# Patient Record
Sex: Male | Born: 1990 | Race: Black or African American | Hispanic: No | Marital: Single | Smoking: Current some day smoker
Health system: Southern US, Community
[De-identification: ages and names within clinical notes are randomized; demographics above are authoritative.]

## PROBLEM LIST (undated history)

## (undated) ENCOUNTER — Ambulatory Visit: Payer: Managed Care, Other (non HMO) | Source: Home / Self Care

## (undated) DIAGNOSIS — W3400XA Accidental discharge from unspecified firearms or gun, initial encounter: Secondary | ICD-10-CM

## (undated) HISTORY — PX: LUNG SURGERY: SHX703

## (undated) HISTORY — PX: LIVER SURGERY: SHX698

## (undated) HISTORY — PX: COLON SURGERY: SHX602

---

## 2018-08-17 ENCOUNTER — Emergency Department (HOSPITAL_COMMUNITY)
Admission: EM | Admit: 2018-08-17 | Discharge: 2018-08-18 | Disposition: A | Discharge status: Home / Self Care | Payer: Self-pay | Attending: Emergency Medicine | Admitting: Emergency Medicine

## 2018-08-17 ENCOUNTER — Encounter (HOSPITAL_COMMUNITY): Payer: Self-pay | Admitting: Emergency Medicine

## 2018-08-17 DIAGNOSIS — J111 Influenza due to unidentified influenza virus with other respiratory manifestations: Secondary | ICD-10-CM | POA: Insufficient documentation

## 2018-08-17 DIAGNOSIS — F17210 Nicotine dependence, cigarettes, uncomplicated: Secondary | ICD-10-CM | POA: Insufficient documentation

## 2018-08-17 DIAGNOSIS — R69 Illness, unspecified: Secondary | ICD-10-CM

## 2018-08-17 HISTORY — DX: Accidental discharge from unspecified firearms or gun, initial encounter: W34.00XA

## 2018-08-17 LAB — COMPREHENSIVE METABOLIC PANEL
ALK PHOS: 49 U/L (ref 38–126)
ALT: 17 U/L (ref 0–44)
AST: 27 U/L (ref 15–41)
Albumin: 3.9 g/dL (ref 3.5–5.0)
Anion gap: 11 (ref 5–15)
BILIRUBIN TOTAL: 1.3 mg/dL — AB (ref 0.3–1.2)
BUN: 15 mg/dL (ref 6–20)
CO2: 29 mmol/L (ref 22–32)
CREATININE: 1.19 mg/dL (ref 0.61–1.24)
Calcium: 9 mg/dL (ref 8.9–10.3)
Chloride: 98 mmol/L (ref 98–111)
GFR calc Af Amer: >60 mL/min (ref >60)
GLUCOSE: 95 mg/dL (ref 70–99)
Potassium: 3.5 mmol/L (ref 3.5–5.1)
Sodium: 138 mmol/L (ref 135–145)
TOTAL PROTEIN: 7.3 g/dL (ref 6.5–8.1)

## 2018-08-17 LAB — CBC WITH DIFFERENTIAL/PLATELET
Abs Immature Granulocytes: 0.01 10*3/uL (ref 0.00–0.07)
Basophils Absolute: 0 10*3/uL (ref 0.0–0.1)
Basophils Relative: 1 %
EOS PCT: 2 %
Eosinophils Absolute: 0.1 10*3/uL (ref 0.0–0.5)
HCT: 40.8 % (ref 39.0–52.0)
HEMOGLOBIN: 13.7 g/dL (ref 13.0–17.0)
Immature Granulocytes: 0 %
Lymphocytes Relative: 45 %
Lymphs Abs: 1.7 10*3/uL (ref 0.7–4.0)
MCH: 28.8 pg (ref 26.0–34.0)
MCHC: 33.6 g/dL (ref 30.0–36.0)
MCV: 85.9 fL (ref 80.0–100.0)
MONO ABS: 0.8 10*3/uL (ref 0.1–1.0)
MONOS PCT: 21 %
Neutro Abs: 1.2 10*3/uL — ABNORMAL LOW (ref 1.7–7.7)
Neutrophils Relative %: 31 %
Platelets: 233 10*3/uL (ref 150–400)
RBC: 4.75 MIL/uL (ref 4.22–5.81)
RDW: 12 % (ref 11.5–15.5)
WBC: 3.8 10*3/uL — ABNORMAL LOW (ref 4.0–10.5)
nRBC: 0 % (ref 0.0–0.2)

## 2018-08-17 LAB — I-STAT CG4 LACTIC ACID, ED: Lactic Acid, Venous: 1.15 mmol/L (ref 0.5–1.9)

## 2018-08-17 MED ORDER — ACETAMINOPHEN 325 MG PO TABS
650.0000 mg | ORAL_TABLET | Freq: Once | ORAL | Status: AC
  Administered 2018-08-17: 650 mg via ORAL
  Filled 2018-08-17: qty 2

## 2018-08-17 NOTE — ED Triage Notes (Signed)
Pt arrives with fever for 3-4 days. Reports back pain, cough and congestion.

## 2018-08-18 LAB — I-STAT CG4 LACTIC ACID, ED: LACTIC ACID, VENOUS: 1.08 mmol/L (ref 0.5–1.9)

## 2018-08-18 MED ORDER — DOXYCYCLINE HYCLATE 100 MG PO CAPS: 100.0000 mg | ORAL_CAPSULE | Freq: Two times a day (BID) | ORAL | 0 refills | Status: DC

## 2018-08-18 NOTE — ED Notes (Signed)
Reviewed d/c instructions with pt, who verbalized understanding and had no outstanding questions. Pt departed in NAD, refused use of wheelchair.   
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
## 2018-08-18 NOTE — Discharge Instructions (Addendum)
Tylenol 1000 mg rotated with Motrin 600 mg every 4 hours as needed for pain or fever.  If you are not improving in the next 2 to 3 days, fill the prescription for doxycycline you have been given this evening.  Return to the ER in the meantime if symptoms significantly worsen or change.

## 2018-11-29 ENCOUNTER — Emergency Department (HOSPITAL_COMMUNITY)
Admission: EM | Admit: 2018-11-29 | Discharge: 2018-11-29 | Disposition: A | Discharge status: Home / Self Care | Payer: Self-pay | Attending: Emergency Medicine | Admitting: Emergency Medicine

## 2018-11-29 ENCOUNTER — Encounter (HOSPITAL_COMMUNITY): Payer: Self-pay | Admitting: Obstetrics and Gynecology

## 2018-11-29 ENCOUNTER — Emergency Department (HOSPITAL_COMMUNITY): Payer: Self-pay

## 2018-11-29 ENCOUNTER — Other Ambulatory Visit: Payer: Self-pay

## 2018-11-29 DIAGNOSIS — Y939 Activity, unspecified: Secondary | ICD-10-CM | POA: Insufficient documentation

## 2018-11-29 DIAGNOSIS — R52 Pain, unspecified: Secondary | ICD-10-CM

## 2018-11-29 DIAGNOSIS — S6991XA Unspecified injury of right wrist, hand and finger(s), initial encounter: Secondary | ICD-10-CM | POA: Insufficient documentation

## 2018-11-29 DIAGNOSIS — Y999 Unspecified external cause status: Secondary | ICD-10-CM | POA: Insufficient documentation

## 2018-11-29 DIAGNOSIS — S40021A Contusion of right upper arm, initial encounter: Secondary | ICD-10-CM | POA: Insufficient documentation

## 2018-11-29 DIAGNOSIS — S8001XA Contusion of right knee, initial encounter: Secondary | ICD-10-CM | POA: Insufficient documentation

## 2018-11-29 DIAGNOSIS — F17210 Nicotine dependence, cigarettes, uncomplicated: Secondary | ICD-10-CM | POA: Insufficient documentation

## 2018-11-29 DIAGNOSIS — Y929 Unspecified place or not applicable: Secondary | ICD-10-CM | POA: Insufficient documentation

## 2018-11-29 MED ORDER — AMOXICILLIN-POT CLAVULANATE 875-125 MG PO TABS: 1.0000 | ORAL_TABLET | Freq: Two times a day (BID) | ORAL | 0 refills | Status: DC

## 2018-11-29 MED ORDER — LIDOCAINE HCL (PF) 1 % IJ SOLN
5.0000 mL | Freq: Once | INTRAMUSCULAR | Status: AC
  Administered 2018-11-29: 5 mL
  Filled 2018-11-29: qty 30

## 2018-11-29 MED ORDER — NAPROXEN 500 MG PO TABS: 500.0000 mg | ORAL_TABLET | Freq: Two times a day (BID) | ORAL | 0 refills | Status: DC

## 2018-11-29 MED ORDER — IBUPROFEN 200 MG PO TABS
600.0000 mg | ORAL_TABLET | Freq: Once | ORAL | Status: AC
  Administered 2018-11-29: 600 mg via ORAL
  Filled 2018-11-29: qty 3

## 2018-11-29 NOTE — ED Notes (Signed)
[phone] [address]
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
## 2018-11-29 NOTE — Discharge Instructions (Addendum)
Call Dr. Ronie Spies office tomorrow and they will tell you what time to see him in the office. It is important that you take the antibiotic and keep the wound clean.
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
## 2019-11-03 ENCOUNTER — Emergency Department (HOSPITAL_COMMUNITY): Payer: Self-pay

## 2019-11-03 ENCOUNTER — Other Ambulatory Visit: Payer: Self-pay

## 2019-11-03 ENCOUNTER — Emergency Department (HOSPITAL_COMMUNITY)
Admission: EM | Admit: 2019-11-03 | Discharge: 2019-11-03 | Disposition: A | Discharge status: Home / Self Care | Payer: Self-pay | Attending: Emergency Medicine | Admitting: Emergency Medicine

## 2019-11-03 DIAGNOSIS — Y999 Unspecified external cause status: Secondary | ICD-10-CM | POA: Diagnosis not present

## 2019-11-03 DIAGNOSIS — S199XXA Unspecified injury of neck, initial encounter: Secondary | ICD-10-CM | POA: Diagnosis present

## 2019-11-03 DIAGNOSIS — F17210 Nicotine dependence, cigarettes, uncomplicated: Secondary | ICD-10-CM | POA: Insufficient documentation

## 2019-11-03 DIAGNOSIS — Y93I9 Activity, other involving external motion: Secondary | ICD-10-CM | POA: Diagnosis not present

## 2019-11-03 DIAGNOSIS — Z79899 Other long term (current) drug therapy: Secondary | ICD-10-CM | POA: Diagnosis not present

## 2019-11-03 DIAGNOSIS — Z791 Long term (current) use of non-steroidal anti-inflammatories (NSAID): Secondary | ICD-10-CM | POA: Insufficient documentation

## 2019-11-03 DIAGNOSIS — S161XXA Strain of muscle, fascia and tendon at neck level, initial encounter: Secondary | ICD-10-CM | POA: Insufficient documentation

## 2019-11-03 DIAGNOSIS — R519 Headache, unspecified: Secondary | ICD-10-CM | POA: Diagnosis not present

## 2019-11-03 DIAGNOSIS — Y92410 Unspecified street and highway as the place of occurrence of the external cause: Secondary | ICD-10-CM | POA: Diagnosis not present

## 2019-11-03 MED ORDER — ACETAMINOPHEN 325 MG PO TABS
650.0000 mg | ORAL_TABLET | Freq: Once | ORAL | Status: AC
  Administered 2019-11-03: 650 mg via ORAL
  Filled 2019-11-03: qty 2

## 2019-11-03 MED ORDER — IBUPROFEN 200 MG PO TABS
600.0000 mg | ORAL_TABLET | Freq: Once | ORAL | Status: AC
  Administered 2019-11-03: 15:00:00 600 mg via ORAL
  Filled 2019-11-03 (×2): qty 3

## 2019-11-03 MED ORDER — METHOCARBAMOL 500 MG PO TABS: 500.0000 mg | ORAL_TABLET | Freq: Three times a day (TID) | ORAL | 0 refills | Status: DC | PRN

## 2019-11-03 NOTE — Discharge Instructions (Addendum)
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
## 2023-02-01 ENCOUNTER — Encounter (HOSPITAL_BASED_OUTPATIENT_CLINIC_OR_DEPARTMENT_OTHER): Payer: Self-pay

## 2023-02-01 ENCOUNTER — Other Ambulatory Visit: Payer: Self-pay

## 2023-02-01 DIAGNOSIS — Z5329 Procedure and treatment not carried out because of patient's decision for other reasons: Secondary | ICD-10-CM | POA: Diagnosis not present

## 2023-02-01 DIAGNOSIS — M79673 Pain in unspecified foot: Secondary | ICD-10-CM | POA: Insufficient documentation

## 2023-02-01 DIAGNOSIS — F17210 Nicotine dependence, cigarettes, uncomplicated: Secondary | ICD-10-CM | POA: Diagnosis not present

## 2023-02-01 NOTE — ED Triage Notes (Signed)
Pt reports a bump behind the arch of the RT foot that has been getting worse for about a month. Pt with 2+ DP pulse. Pt with sensation intact. Reports is making it difficult for him to walk. Not diabetics.

## 2023-02-02 ENCOUNTER — Emergency Department (HOSPITAL_BASED_OUTPATIENT_CLINIC_OR_DEPARTMENT_OTHER)
Admission: EM | Admit: 2023-02-02 | Discharge: 2023-02-02 | Discharge status: Against Medical Advice | Payer: Managed Care, Other (non HMO) | Attending: Emergency Medicine | Admitting: Emergency Medicine

## 2023-02-02 ENCOUNTER — Ambulatory Visit
Admission: EM | Admit: 2023-02-02 | Discharge: 2023-02-02 | Disposition: A | Discharge status: Home / Self Care | Payer: Managed Care, Other (non HMO) | Attending: Family Medicine | Admitting: Family Medicine

## 2023-02-02 DIAGNOSIS — L84 Corns and callosities: Secondary | ICD-10-CM

## 2023-02-02 DIAGNOSIS — M79673 Pain in unspecified foot: Secondary | ICD-10-CM

## 2023-02-02 MED ORDER — LIDOCAINE HCL (PF) 1 % IJ SOLN
30.0000 mL | Freq: Once | INTRAMUSCULAR | Status: DC
  Filled 2023-02-02: qty 30

## 2023-02-02 NOTE — ED Triage Notes (Signed)
Pt presents with callous on bottom of right foot X 1 month.
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
## 2023-02-02 NOTE — ED Notes (Signed)
Pt left AMA, was able to get pt to sign AMA in chart, Dr. Pilar Plate notified. PT refused vitals on departure.
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
## 2023-05-14 ENCOUNTER — Encounter (HOSPITAL_COMMUNITY): Payer: Self-pay

## 2023-05-14 ENCOUNTER — Emergency Department (HOSPITAL_COMMUNITY): Payer: Managed Care, Other (non HMO)

## 2023-05-14 ENCOUNTER — Emergency Department (HOSPITAL_COMMUNITY)
Admission: EM | Admit: 2023-05-14 | Discharge: 2023-05-14 | Disposition: A | Discharge status: Home / Self Care | Payer: Managed Care, Other (non HMO) | Attending: Emergency Medicine | Admitting: Emergency Medicine

## 2023-05-14 ENCOUNTER — Other Ambulatory Visit: Payer: Self-pay

## 2023-05-14 DIAGNOSIS — N492 Inflammatory disorders of scrotum: Secondary | ICD-10-CM

## 2023-05-14 LAB — URINALYSIS, ROUTINE W REFLEX MICROSCOPIC
Bilirubin Urine: NEGATIVE
Glucose, UA: NEGATIVE mg/dL
Hgb urine dipstick: NEGATIVE
Ketones, ur: NEGATIVE mg/dL
Leukocytes,Ua: NEGATIVE
Nitrite: NEGATIVE
Protein, ur: NEGATIVE mg/dL
Specific Gravity, Urine: 1.025 (ref 1.005–1.030)
pH: 6 (ref 5.0–8.0)

## 2023-05-14 LAB — CBC WITH DIFFERENTIAL/PLATELET
Abs Immature Granulocytes: 0.02 10*3/uL (ref 0.00–0.07)
Basophils Absolute: 0.1 10*3/uL (ref 0.0–0.1)
Basophils Relative: 1 %
Eosinophils Absolute: 0.3 10*3/uL (ref 0.0–0.5)
Eosinophils Relative: 4 %
HCT: 40.8 % (ref 39.0–52.0)
Hemoglobin: 14.2 g/dL (ref 13.0–17.0)
Immature Granulocytes: 0 %
Lymphocytes Relative: 22 %
Lymphs Abs: 2.1 10*3/uL (ref 0.7–4.0)
MCH: 29.5 pg (ref 26.0–34.0)
MCHC: 34.8 g/dL (ref 30.0–36.0)
MCV: 84.6 fL (ref 80.0–100.0)
Monocytes Absolute: 2.1 10*3/uL — ABNORMAL HIGH (ref 0.1–1.0)
Monocytes Relative: 22 %
Neutro Abs: 4.8 10*3/uL (ref 1.7–7.7)
Neutrophils Relative %: 51 %
Platelets: 322 10*3/uL (ref 150–400)
RBC: 4.82 MIL/uL (ref 4.22–5.81)
RDW: 12.1 % (ref 11.5–15.5)
WBC: 9.3 10*3/uL (ref 4.0–10.5)
nRBC: 0 % (ref 0.0–0.2)

## 2023-05-14 LAB — COMPREHENSIVE METABOLIC PANEL
ALT: 24 U/L (ref 0–44)
AST: 27 U/L (ref 15–41)
Albumin: 3.8 g/dL (ref 3.5–5.0)
Alkaline Phosphatase: 63 U/L (ref 38–126)
Anion gap: 12 (ref 5–15)
BUN: 14 mg/dL (ref 6–20)
CO2: 23 mmol/L (ref 22–32)
Calcium: 9.2 mg/dL (ref 8.9–10.3)
Chloride: 98 mmol/L (ref 98–111)
Creatinine, Ser: 0.89 mg/dL (ref 0.61–1.24)
GFR, Estimated: >60 mL/min (ref >60)
Glucose, Bld: 83 mg/dL (ref 70–99)
Potassium: 3.6 mmol/L (ref 3.5–5.1)
Sodium: 133 mmol/L — ABNORMAL LOW (ref 135–145)
Total Bilirubin: 1.1 mg/dL (ref 0.3–1.2)
Total Protein: 7.8 g/dL (ref 6.5–8.1)

## 2023-05-14 LAB — LIPASE, BLOOD: Lipase: 32 U/L (ref 11–51)

## 2023-05-14 MED ORDER — ONDANSETRON HCL 4 MG/2ML IJ SOLN
4.0000 mg | Freq: Once | INTRAMUSCULAR | Status: AC
  Administered 2023-05-14: 4 mg via INTRAVENOUS
  Filled 2023-05-14: qty 2

## 2023-05-14 MED ORDER — MORPHINE SULFATE (PF) 4 MG/ML IV SOLN
4.0000 mg | Freq: Once | INTRAVENOUS | Status: AC
  Administered 2023-05-14: 4 mg via INTRAVENOUS
  Filled 2023-05-14: qty 1

## 2023-05-14 MED ORDER — SODIUM CHLORIDE 0.9 % IV BOLUS
1000.0000 mL | Freq: Once | INTRAVENOUS | Status: AC
  Administered 2023-05-14: 1000 mL via INTRAVENOUS

## 2023-05-14 MED ORDER — DOXYCYCLINE HYCLATE 100 MG PO CAPS: 100.0000 mg | ORAL_CAPSULE | Freq: Two times a day (BID) | ORAL | 0 refills | Status: AC

## 2023-05-14 MED ORDER — IOHEXOL 300 MG/ML  SOLN
100.0000 mL | Freq: Once | INTRAMUSCULAR | Status: AC | PRN
  Administered 2023-05-14: 100 mL via INTRAVENOUS

## 2023-05-14 MED ORDER — DOXYCYCLINE HYCLATE 100 MG PO TABS
100.0000 mg | ORAL_TABLET | Freq: Once | ORAL | Status: AC
  Administered 2023-05-14: 100 mg via ORAL
  Filled 2023-05-14: qty 1

## 2023-05-14 MED ORDER — LIDOCAINE HCL (PF) 1 % IJ SOLN
1.0000 mL | Freq: Once | INTRAMUSCULAR | Status: AC
  Administered 2023-05-14: 2.1 mL
  Filled 2023-05-14: qty 30

## 2023-05-14 MED ORDER — CEFTRIAXONE SODIUM 1 G IJ SOLR
500.0000 mg | Freq: Once | INTRAMUSCULAR | Status: AC
  Administered 2023-05-14: 500 mg via INTRAMUSCULAR
  Filled 2023-05-14: qty 10

## 2023-05-14 MED ORDER — SODIUM CHLORIDE (PF) 0.9 % IJ SOLN
INTRAMUSCULAR | Status: AC
  Filled 2023-05-14: qty 50

## 2023-05-14 NOTE — ED Triage Notes (Signed)
Pt reports abscess to right side groin and reports pain and discharge from area. Pt denies pain with urination and denies penile discharge.

## 2023-05-14 NOTE — Discharge Instructions (Addendum)
Evaluation revealed he did have a scrotal abscess.  Treating with doxycycline which is an antibiotic.  Please follow-up with urology as well for reevaluation.  If your symptoms worsen in any way please return for further evaluation.  Please follow-up MyChart for the results of your STD panel.  Advised that you abstain from sex until the results are posted.

## 2023-05-14 NOTE — ED Provider Notes (Cosign Needed Addendum)
[place] EMERGENCY DEPARTMENT AT [facility] Provider Note   CSN: [id number] Arrival date & time: 05/14/23  0532     History  Chief Complaint  Patient presents with   Abscess   HPI Steven Lowe is a 32 y.o. male presenting for abscess.  Noticed it about 3 days ago.  Located in the right side of his groin extending down into his scrotum.  States it started to ooze pustulant discharge earlier this morning.  States it is very painful.  The pain radiates into his groin.  Also notes another bump in his right groin as well that he noticed shortly after the abscess. Denies fever and chills. Also requesting STI evaluation. Denies penile discharge or dysuria.   Abscess      Home Medications Prior to Admission medications   Medication Sig Start Date End Date Taking? Authorizing Provider  doxycycline (VIBRAMYCIN) 100 MG capsule Take 1 capsule (100 mg total) by mouth 2 (two) times daily. 05/14/23  Yes Gareth Eagle, PA-C      Allergies    Patient has no known allergies.    Review of Systems   See HPI for pertinent positves  Physical Exam Updated Vital Signs BP (!) 97/56 (BP Location: Right Arm)   Pulse 89   Temp 98.4 F (36.9 C)   Resp 18   Ht 5\' 7"  (1.702 m)   Wt 59 kg   SpO2 99%   BMI 20.36 kg/m  Physical Exam Vitals and nursing note reviewed.  HENT:     Head: Normocephalic and atraumatic.     Mouth/Throat:     Mouth: Mucous membranes are moist.  Eyes:     General:        Right eye: No discharge.        Left eye: No discharge.     Conjunctiva/sclera: Conjunctivae normal.  Cardiovascular:     Rate and Rhythm: Normal rate and regular rhythm.     Pulses: Normal pulses.     Heart sounds: Normal heart sounds.  Pulmonary:     Effort: Pulmonary effort is normal.     Breath sounds: Normal breath sounds.  Abdominal:     General: Abdomen is flat.     Palpations: Abdomen is soft.     Tenderness: There is no abdominal tenderness.   Genitourinary:     Skin:    General: Skin is warm and dry.  Neurological:     General: No focal deficit present.  Psychiatric:        Mood and Affect: Mood normal.     ED Results / Procedures / Treatments   Labs (all labs ordered are listed, but only abnormal results are displayed) Labs Reviewed  COMPREHENSIVE METABOLIC PANEL - Abnormal; Notable for the following components:      Result Value   Sodium 133 (*)    All other components within normal limits  CBC WITH DIFFERENTIAL/PLATELET - Abnormal; Notable for the following components:   Monocytes Absolute 2.1 (*)    All other components within normal limits  LIPASE, BLOOD  URINALYSIS, ROUTINE W REFLEX MICROSCOPIC  CBC WITH DIFFERENTIAL/PLATELET  HIV ANTIBODY (ROUTINE TESTING W REFLEX)  RPR  GC/CHLAMYDIA PROBE AMP ([place]) NOT AT [facility]    EKG None  Radiology CT ABDOMEN PELVIS W CONTRAST  Result Date: 05/14/2023 CLINICAL DATA:  32 year old male with history of scrotal abscess concerning for possible Fournier's gangrene. EXAM: CT ABDOMEN AND PELVIS WITH CONTRAST TECHNIQUE: Multidetector CT imaging of the abdomen  and pelvis was performed using the standard protocol following bolus administration of intravenous contrast. RADIATION DOSE REDUCTION: This exam was performed according to the departmental dose-optimization program which includes automated exposure control, adjustment of the mA and/or kV according to patient size and/or use of iterative reconstruction technique. CONTRAST:  OMNIPAQUE IOHEXOL 300 MG/ML  SOLN COMPARISON:  No priors. FINDINGS: Lower chest: Unremarkable. Hepatobiliary: No suspicious cystic or solid hepatic lesions. No intra or extrahepatic biliary ductal dilatation. Gallbladder is unremarkable in appearance. Pancreas: No pancreatic mass. No pancreatic ductal dilatation. No pancreatic or peripancreatic fluid collections or inflammatory changes. Spleen: Spleen is not visualized, presumably surgically  absent. Adrenals/Urinary Tract: Bilateral kidneys and adrenal glands are normal in appearance. No hydroureteronephrosis. Urinary bladder is unremarkable in appearance. Stomach/Bowel: The appearance of the stomach is normal. No pathologic dilatation of small bowel or colon. The appendix is not confidently identified and may be surgically absent. Regardless, there are no inflammatory changes noted adjacent to the cecum to suggest the presence of an acute appendicitis at this time. Vascular/Lymphatic: No significant atherosclerotic disease, aneurysm or dissection noted in the abdominal or pelvic vasculature. Enlarged right inguinal lymph node measuring 1.4 cm in short axis. Enlarged right obturator lymph node measuring 1.2 cm in short axis. Prominent borderline enlarged left inguinal lymph node measuring 8 mm in short axis. No other lymphadenopathy noted elsewhere in the abdomen or pelvis. Reproductive: Prostate gland and seminal vesicles are unremarkable in appearance. Visualized portions of the scrotum are grossly unremarkable. Other: In the right inguinal region there is skin thickening and enhancement adjacent to the scrotum, best appreciated on axial image 82 of series 2, without well-defined rim enhancing fluid collection to indicate an abscess. No significant volume of ascites. No pneumoperitoneum. Musculoskeletal: There are no aggressive appearing lytic or blastic lesions noted in the visualized portions of the skeleton. IMPRESSION: 1. Skin thickening and enhancement in the right inguinal region involving the medial upper right thigh and a portion of the scrotum, likely reflecting cellulitis. No definite abscess identified at this time. Multiple borderline enlarged and mildly enlarged pelvic lymph nodes, as above, likely reactive. 2. No other acute findings noted elsewhere in the abdomen or pelvis. Electronically Signed   By: Trudie Reed M.D.   On: 05/14/2023 08:21    Procedures Procedures     Medications Ordered in ED Medications  sodium chloride 0.9 % bolus 1,000 mL (0 mLs Intravenous Stopped 05/14/23 0919)  morphine (PF) 4 MG/ML injection 4 mg (4 mg Intravenous Given 05/14/23 0743)  ondansetron (ZOFRAN) injection 4 mg (4 mg Intravenous Given 05/14/23 0743)  iohexol (OMNIPAQUE) 300 MG/ML solution 100 mL (100 mLs Intravenous Contrast Given 05/14/23 0759)  cefTRIAXone (ROCEPHIN) injection 500 mg (500 mg Intramuscular Given 05/14/23 0919)  lidocaine (PF) (XYLOCAINE) 1 % injection 1-2.1 mL (2.1 mLs Other Given 05/14/23 0919)  doxycycline (VIBRA-TABS) tablet 100 mg (100 mg Oral Given 05/14/23 1191)    ED Course/ Medical Decision Making/ A&P Clinical Course as of 05/14/23 0941  Sat May 14, 2023  0754 Eosinophils Absolute: 0.3 [JR]    Clinical Course User Index [JR] Gareth Eagle, PA-C                             Medical Decision Making Amount and/or Complexity of Data Reviewed Labs: ordered. Decision-making details documented in ED Course. Radiology: ordered.  Risk Prescription drug management.   32 year old well-appearing male presenting for abscess in the groin. Exam notable  for open scrotal abscess and and likely associated lymphadenopathy.  Likely simple scrotal abscess is actively draining but could not fully rule out Fournier's gangrene.  CT scan however was fortunately negative aside from associated cellulitis. Started on doxycycline and gave dose of ceftriaxone as well for treatment of STI empirically.  Advised him to follow-up with urology for scrotal abscess reevaluation.  Also advised to follow MyChart for his STI results.     Final Clinical Impression(s) / ED Diagnoses Final diagnoses:  Scrotal abscess    Rx / DC Orders ED Discharge Orders          Ordered    doxycycline (VIBRAMYCIN) 100 MG capsule  2 times daily        05/14/23 0940              Gareth Eagle, PA-C 05/14/23 0939    Gareth Eagle, PA-C 05/14/23 1610    Bethann Berkshire, MD 05/16/23 1114

## 2023-05-14 NOTE — ED Notes (Signed)
Pt aware urine sample needed. Given urinal.

## 2023-05-15 ENCOUNTER — Telehealth: Payer: Managed Care, Other (non HMO) | Admitting: Nurse Practitioner

## 2023-05-15 DIAGNOSIS — N5089 Other specified disorders of the male genital organs: Secondary | ICD-10-CM

## 2023-05-15 NOTE — Patient Instructions (Signed)
  Steven Lowe, thank you for joining Claiborne Rigg, NP for today's virtual visit.  While this provider is not your primary care provider (PCP), if your PCP is located in our provider database this encounter information will be shared with them immediately following your visit.   A [facility] MyChart account gives you access to today's visit and all your visits, tests, and labs performed at [facility] " click here if you don't have a [facility] MyChart account or go to [email or web address]  Consent: (Patient) Steven Lowe provided verbal consent for this virtual visit at the beginning of the encounter.  Current Medications:  Current Outpatient Medications:    doxycycline (VIBRAMYCIN) 100 MG capsule, Take 1 capsule (100 mg total) by mouth 2 (two) times daily., Disp: 20 capsule, Rfl: 0   Medications ordered in this encounter:  No orders of the defined types were placed in this encounter.    *If you need refills on other medications prior to your next appointment, please contact your pharmacy*  Follow-Up: Call back or seek an in-person evaluation if the symptoms worsen or if the condition fails to improve as anticipated.  [facility] [phone]  Other Instructions Needs additional testing: HSV, Trich, HIV, syphilis. Recommended urgent care or Health department as this is not an emergency. GC/Chlamydia results are not finalized at this time.   Finish abx as instructed   If you have been instructed to have an in-person evaluation today at a local Urgent Care facility, please use the link below. It will take you to a list of all of our available [facility], including address, phone number and hours of operation. Please do not delay care.  [facility]  If you or a family member do not have a primary care provider, use the link below to schedule a visit and establish care. When you choose a [facility] primary care  physician or advanced practice provider, you gain a long-term partner in health. Find a Primary Care Provider  Learn more about [facility]'s in-office and virtual care options: [facility] - Get Care Now

## 2023-05-15 NOTE — Progress Notes (Signed)
Virtual Visit Consent   Steven Lowe, you are scheduled for a virtual visit with a [facility] provider today. Just as with appointments in the office, your consent must be obtained to participate. Your consent will be active for this visit and any virtual visit you may have with one of our providers in the next 365 days. If you have a MyChart account, a copy of this consent can be sent to you electronically.  As this is a virtual visit, video technology does not allow for your provider to perform a traditional examination. This may limit your provider's ability to fully assess your condition. If your provider identifies any concerns that need to be evaluated in person or the need to arrange testing (such as labs, EKG, etc.), we will make arrangements to do so. Although advances in technology are sophisticated, we cannot ensure that it will always work on either your end or our end. If the connection with a video visit is poor, the visit may have to be switched to a telephone visit. With either a video or telephone visit, we are not always able to ensure that we have a secure connection.  By engaging in this virtual visit, you consent to the provision of healthcare and authorize for your insurance to be billed (if applicable) for the services provided during this visit. Depending on your insurance coverage, you may receive a charge related to this service.  I need to obtain your verbal consent now. Are you willing to proceed with your visit today? Amedeo A Constantino has provided verbal consent on 05/15/2023 for a virtual visit (video or telephone). Claiborne Rigg, NP  Date: 05/15/2023 9:00 AM  Virtual Visit via Video Note   I, Claiborne Rigg, connected with  Steven Lowe  ([id number], 1990-12-21) on 05/15/23 at  8:45 AM EDT by a video-enabled telemedicine application and verified that I am speaking with the correct person using two identifiers.  Location: Patient: Virtual Visit Location  Patient: Home Provider: Virtual Visit Location Provider: Home Office   I discussed the limitations of evaluation and management by telemedicine and the availability of in person appointments. The patient expressed understanding and agreed to proceed.    History of Present Illness: Steven Lowe is a 32 y.o. who identifies as a male who was assigned male at birth, and is being seen today for requesting labs results.  HPI: [name] is requesting lab results for recent GC/Chlamydia testing. He also states he has sores in his rectal area and wants to know what this could be. He has been sexually active with only 1 partner for the past 6 years however they recently broke up. He was seen in the ED yesterday for penile abscess and was prescribed doxy and instructed to follow up with Urology.   Problems: There are no problems to display for this patient.   Allergies: No Known Allergies Medications:  Current Outpatient Medications:    doxycycline (VIBRAMYCIN) 100 MG capsule, Take 1 capsule (100 mg total) by mouth 2 (two) times daily., Disp: 20 capsule, Rfl: 0  Observations/Objective: Patient is well-developed, well-nourished in no acute distress.  Resting comfortably  at home.  Head is normocephalic, atraumatic.  No labored breathing.  Speech is clear and coherent with logical content.  Patient is alert and oriented at baseline.    Assessment and Plan: 1. Genital lesion, male Needs additional testing: HSV, Trich, HIV, syphilis. Recommended urgent care or Health department as this is not an emergency.  GC/Chlamydia results are not finalized at this time.   Finish abx as instructed  Follow Up Instructions: I discussed the assessment and treatment plan with the patient. The patient was provided an opportunity to ask questions and all were answered. The patient agreed with the plan and demonstrated an understanding of the instructions.  A copy of instructions were sent to the patient via  MyChart unless otherwise noted below.   The patient was advised to call back or seek an in-person evaluation if the symptoms worsen or if the condition fails to improve as anticipated.  Time:  I spent 15 minutes with the patient via telehealth technology discussing the above problems/concerns.    Claiborne Rigg, NP

## 2023-05-16 LAB — GC/CHLAMYDIA PROBE AMP (~~LOC~~) NOT AT ARMC
Chlamydia: NEGATIVE
Comment: NEGATIVE
Comment: NORMAL
Neisseria Gonorrhea: NEGATIVE

## 2023-05-19 ENCOUNTER — Other Ambulatory Visit: Payer: Self-pay

## 2023-05-19 ENCOUNTER — Emergency Department (HOSPITAL_COMMUNITY): Payer: Managed Care, Other (non HMO)

## 2023-05-19 ENCOUNTER — Emergency Department (HOSPITAL_COMMUNITY)
Admission: EM | Admit: 2023-05-19 | Discharge: 2023-05-19 | Disposition: A | Discharge status: Home / Self Care | Payer: Managed Care, Other (non HMO) | Attending: Emergency Medicine | Admitting: Emergency Medicine

## 2023-05-19 ENCOUNTER — Encounter (HOSPITAL_COMMUNITY): Payer: Self-pay

## 2023-05-19 DIAGNOSIS — L02214 Cutaneous abscess of groin: Secondary | ICD-10-CM | POA: Diagnosis present

## 2023-05-19 DIAGNOSIS — N492 Inflammatory disorders of scrotum: Secondary | ICD-10-CM | POA: Diagnosis not present

## 2023-05-19 LAB — CBC
HCT: 34.5 % — ABNORMAL LOW (ref 39.0–52.0)
Hemoglobin: 12 g/dL — ABNORMAL LOW (ref 13.0–17.0)
MCH: 29.6 pg (ref 26.0–34.0)
MCHC: 34.8 g/dL (ref 30.0–36.0)
MCV: 85.2 fL (ref 80.0–100.0)
Platelets: 309 10*3/uL (ref 150–400)
RBC: 4.05 MIL/uL — ABNORMAL LOW (ref 4.22–5.81)
RDW: 12.1 % (ref 11.5–15.5)
WBC: 7.5 10*3/uL (ref 4.0–10.5)
nRBC: 0 % (ref 0.0–0.2)

## 2023-05-19 LAB — BASIC METABOLIC PANEL
Anion gap: 9 (ref 5–15)
BUN: 16 mg/dL (ref 6–20)
CO2: 27 mmol/L (ref 22–32)
Calcium: 8.9 mg/dL (ref 8.9–10.3)
Chloride: 102 mmol/L (ref 98–111)
Creatinine, Ser: 0.89 mg/dL (ref 0.61–1.24)
GFR, Estimated: >60 mL/min (ref >60)
Glucose, Bld: 102 mg/dL — ABNORMAL HIGH (ref 70–99)
Potassium: 3.5 mmol/L (ref 3.5–5.1)
Sodium: 138 mmol/L (ref 135–145)

## 2023-05-19 MED ORDER — OXYCODONE-ACETAMINOPHEN 5-325 MG PO TABS
1.0000 | ORAL_TABLET | Freq: Four times a day (QID) | ORAL | 0 refills | Status: AC | PRN
Start: 2023-05-19 — End: ?

## 2023-05-19 MED ORDER — FENTANYL CITRATE PF 50 MCG/ML IJ SOSY
50.0000 ug | PREFILLED_SYRINGE | Freq: Once | INTRAMUSCULAR | Status: AC
  Administered 2023-05-19: 50 ug via INTRAVENOUS
  Filled 2023-05-19: qty 1

## 2023-05-19 MED ORDER — IOHEXOL 300 MG/ML  SOLN
100.0000 mL | Freq: Once | INTRAMUSCULAR | Status: AC | PRN
  Administered 2023-05-19: 100 mL via INTRAVENOUS

## 2023-05-19 NOTE — ED Provider Notes (Signed)
[place] EMERGENCY DEPARTMENT AT [facility] Provider Note   CSN: [id number] Arrival date & time: 05/19/23  1328     History  Chief Complaint  Patient presents with   Abscess    Steven Lowe is a 32 y.o. male who presents the emergency department complaining of ongoing right sided groin abscess.  Patient initially noticed this about a week ago.  He has been on antibiotics, but has worsening redness and pain.  He feels the redness is starting to streak down his leg and he has some associated "bumps" on the skin of his right upper thigh.  Denies any fever or chills.  Reports compliance with antibiotics.   Abscess Associated symptoms: no fever        Home Medications Prior to Admission medications   Medication Sig Start Date End Date Taking? Authorizing Provider  oxyCODONE-acetaminophen (PERCOCET/ROXICET) 5-325 MG tablet Take 1 tablet by mouth every 6 (six) hours as needed for severe pain. 05/19/23  Yes Gauri Galvao T, PA-C  doxycycline (VIBRAMYCIN) 100 MG capsule Take 1 capsule (100 mg total) by mouth 2 (two) times daily. 05/14/23   Gareth Eagle, PA-C      Allergies    Patient has no known allergies.    Review of Systems   Review of Systems  Constitutional:  Negative for chills and fever.  Genitourinary:  Positive for scrotal swelling.  Skin:        Abscess in groin  All other systems reviewed and are negative.   Physical Exam Updated Vital Signs BP 105/62 (BP Location: Right Arm)   Pulse (!) 50   Temp 98.5 F (36.9 C) (Oral)   Resp 14   Ht 5\' 7"  (1.702 m)   Wt 59 kg   SpO2 100%   BMI 20.36 kg/m  Physical Exam Vitals and nursing note reviewed. Exam conducted with a chaperone present.  Constitutional:      Appearance: Normal appearance.  HENT:     Head: Normocephalic and atraumatic.  Eyes:     Conjunctiva/sclera: Conjunctivae normal.  Pulmonary:     Effort: Pulmonary effort is normal. No respiratory distress.  Genitourinary:     Penis: Circumcised.      Testes:        Right: Swelling present. Tenderness not present.        Left: Tenderness or swelling not present.     Comments: Tenderness and swelling of the right scrotum, without testicular tenderness, active draining of purulent fluid.  Small red papular lesions noted to the skin of the right upper thigh. No crepitus.  Skin:    General: Skin is warm and dry.  Neurological:     Mental Status: He is alert.  Psychiatric:        Mood and Affect: Mood normal.        Behavior: Behavior normal.     ED Results / Procedures / Treatments   Labs (all labs ordered are listed, but only abnormal results are displayed) Labs Reviewed  CBC - Abnormal; Notable for the following components:      Result Value   RBC 4.05 (*)    Hemoglobin 12.0 (*)    HCT 34.5 (*)    All other components within normal limits  BASIC METABOLIC PANEL - Abnormal; Notable for the following components:   Glucose, Bld 102 (*)    All other components within normal limits    EKG None  Radiology CT PELVIS W CONTRAST  Result Date: 05/19/2023 CLINICAL  DATA:  Scrotal abscess. EXAM: CT PELVIS WITH CONTRAST TECHNIQUE: Multidetector CT imaging of the pelvis was performed using the standard protocol following the bolus administration of intravenous contrast. RADIATION DOSE REDUCTION: This exam was performed according to the departmental dose-optimization program which includes automated exposure control, adjustment of the mA and/or kV according to patient size and/or use of iterative reconstruction technique. CONTRAST:  OMNIPAQUE IOHEXOL 300 MG/ML  SOLN COMPARISON:  CT 05/14/2023 FINDINGS: Urinary Tract:  Preserved contours of the urinary bladder. Bowel: The visualized bowel in the pelvis is nondilated. There is moderate colonic stool. Air and fluid along loops of nondilated small bowel. There is minimal intra-abdominal and pelvic fat. Vascular/Lymphatic: Grossly preserved iliac vessels. As on the prior  there are some prominent inguinal nodes. Right-greater-than-left. These are similar to the previous examination. No new lymph node enlargement identified in the visualized pelvis. There is a additional nodes that are enlarged along the right external iliac chain which previously had short axis dimension of 12 mm and today on series 2, image 51 12 mm again. Reproductive: Preserved prostate. The area of skin thickening with enhancement along the right side of the perineum and inguinal region persists. No rim enhancing fluid collections. Subcutaneous fat stranding identified. No soft tissue gas identified. By imaging the area appears similar. No clear extension above the pelvic floor musculature. Other:  As above.  No free fluid in the dependent pelvis. Musculoskeletal: No suspicious bone lesions identified. IMPRESSION: Once again there is skin thickening with enhancement and stranding along the right side of the perineum down to the scrotal margin and involving the inguinal region in a similar distribution to previous examination. No soft tissue gas identified. No rim enhancing fluid collection seen. Stable mildly enlarged inguinal and external iliac chain right-sided lymph nodes. Overall recommend continued follow-up. Electronically Signed   By: Karen Kays M.D.   On: 05/19/2023 16:54    Procedures Procedures    Medications Ordered in ED Medications  iohexol (OMNIPAQUE) 300 MG/ML solution 100 mL (100 mLs Intravenous Contrast Given 05/19/23 1633)  fentaNYL (SUBLIMAZE) injection 50 mcg (50 mcg Intravenous Given 05/19/23 1654)    ED Course/ Medical Decision Making/ A&P                                 Medical Decision Making Amount and/or Complexity of Data Reviewed Labs: ordered. Radiology: ordered.  Risk Prescription drug management.   This patient is a 32 y.o. male  who presents to the ED for concern of scrotal swelling, known abscess.   Differential diagnoses prior to evaluation: The emergent  differential diagnosis includes, but is not limited to,  cellulitis, abscess, Fournier's gangrene . This is not an exhaustive differential.   Past Medical History / Co-morbidities / Social History: No significant PMH  Additional history: Chart reviewed. Pertinent results include: He came to the ER on 7/27, had laboratory evaluation and CT scan to rule out foreign years gangrene.  This was unremarkable.  He was started on doxycycline, was given a dose of Rocephin for possible STI concomitant infection.  He was recommended to follow-up with urology.  He had a video visit on 7/28, and was directed to finish his antibiotics, recommended having further STD testing including HSV, trichomonas, HIV, and syphilis.  Physical Exam: Physical exam performed. The pertinent findings include: Normal vital signs, no acute distress, afebrile.  Tenderness and swelling noted to the right scrotum with overlying erythema,  without testicular tenderness or mass.  Active draining of purulent fluid.  Small papular lesions on the right upper thigh.  Lab Tests/Imaging studies: I personally interpreted labs/imaging and the pertinent results include:  no leukocytosis, hemoglobin stable. BMP unremarkable.   CT pelvis with skin thickening and stranding along right perineum similar to prior. No soft tissue gas. No rim enhancing fluid collection. Stable mildly enlarged lymph nodes.  I agree with the radiologist interpretation.  Medications: I ordered medication including fentanyl.  I have reviewed the patients home medicines and have made adjustments as needed.   Disposition: After consideration of the diagnostic results and the patients response to treatment, I feel that emergency department workup does not suggest an emergent condition requiring admission or immediate intervention beyond what has been performed at this time. The plan is: Discharged home with ongoing antibiotic regimen for cellulitis and actively draining  scrotal abscess.  No evidence of sepsis, no fournier's gangrene on imaging.  As patient has only had about 2 days of doxycycline, do not feel he has failed outpatient management quite yet.  Will prescribe short course of pain medication.  Strongly recommended he follow-up with urology as he had not done so yet. The patient is safe for discharge and has been instructed to return immediately for worsening symptoms, change in symptoms or any other concerns.  Final Clinical Impression(s) / ED Diagnoses Final diagnoses:  Scrotal abscess    Rx / DC Orders ED Discharge Orders          Ordered    oxyCODONE-acetaminophen (PERCOCET/ROXICET) 5-325 MG tablet  Every 6 hours PRN        05/19/23 1731           Portions of this report may have been transcribed using voice recognition software. Every effort was made to ensure accuracy; however, inadvertent computerized transcription errors may be present.    Jeanella Flattery 05/19/23 1755    Terald Sleeper, MD 05/21/23 [phone]

## 2023-05-19 NOTE — Discharge Instructions (Signed)
You were seen in the ER for worsening redness and pain of your abscess.  Your lab work and imaging today looked reassuring. I want you to continue to antibiotics you were prescribed and I am prescribing you a short course of pain medication.  Please follow up with the urologist. I have attached their contact information. Call them first thing tomorrow to request a follow up appointment.   Continue to monitor how you're doing and return to the ER for new or worsening symptoms such as fever, no improvement after finishing the course of antibiotics.

## 2023-05-19 NOTE — ED Triage Notes (Signed)
Patient has had a right sided groin abscess since Friday. Still is taking doxycycline. Said the pain is moving down his groin, redness is swelling, and odor to drainage.

## 2023-10-14 ENCOUNTER — Emergency Department (HOSPITAL_COMMUNITY)
Admission: EM | Admit: 2023-10-14 | Discharge: 2023-10-15 | Discharge status: Against Medical Advice | Payer: Managed Care, Other (non HMO) | Attending: Emergency Medicine | Admitting: Emergency Medicine

## 2023-10-14 DIAGNOSIS — Z5321 Procedure and treatment not carried out due to patient leaving prior to being seen by health care provider: Secondary | ICD-10-CM | POA: Diagnosis not present

## 2023-10-14 DIAGNOSIS — R519 Headache, unspecified: Secondary | ICD-10-CM | POA: Diagnosis not present

## 2023-10-14 DIAGNOSIS — H9202 Otalgia, left ear: Secondary | ICD-10-CM | POA: Diagnosis present

## 2023-10-15 ENCOUNTER — Other Ambulatory Visit: Payer: Self-pay

## 2023-10-15 NOTE — ED Triage Notes (Signed)
Patient reports left ear ache for 2 weeks and headache this evening unrelieved by OTC pain medication , denies head injury.

## 2023-10-15 NOTE — ED Notes (Signed)
Pt handed in labels and stated he was leaving

## 2023-10-21 ENCOUNTER — Other Ambulatory Visit: Payer: Self-pay

## 2023-10-21 ENCOUNTER — Emergency Department (HOSPITAL_COMMUNITY): Payer: Managed Care, Other (non HMO)

## 2023-10-21 ENCOUNTER — Encounter (HOSPITAL_COMMUNITY): Payer: Self-pay

## 2023-10-21 ENCOUNTER — Emergency Department (HOSPITAL_COMMUNITY)
Admission: EM | Admit: 2023-10-21 | Discharge: 2023-10-21 | Discharge status: Against Medical Advice | Payer: Managed Care, Other (non HMO) | Attending: Emergency Medicine | Admitting: Emergency Medicine

## 2023-10-21 DIAGNOSIS — Z5321 Procedure and treatment not carried out due to patient leaving prior to being seen by health care provider: Secondary | ICD-10-CM | POA: Insufficient documentation

## 2023-10-21 DIAGNOSIS — M79645 Pain in left finger(s): Secondary | ICD-10-CM | POA: Insufficient documentation

## 2023-10-21 NOTE — ED Provider Triage Note (Signed)
 Emergency Medicine Provider Triage Evaluation Note  Steven Lowe , a 33 y.o. male  was evaluated in triage.  Pt complains of left pinky pain, swelling.  Review of Systems  Positive: Left pinky swelling pain.  Negative: Rash  Physical Exam  BP 112/69   Pulse 65   Temp 98.2 F (36.8 C) (Oral)   Resp 15   Ht 5' 7 (1.702 m)   Wt 59 kg   SpO2 95%   BMI 20.36 kg/m  Gen:   Awake, no distress   Resp:  Normal effort  MSK:   Moves extremities without difficulty  Other:  Patient reporting with fifth digit swelling and pain.  He has decreased range of motion secondary to pain.  No obvious cellulitis.  Swelling is diffuse.  No obvious deformity.  Significant tenderness to palpation over metacarpal and proximal phalanx.  Medical Decision Making  Medically screening exam initiated at 11:19 AM.  Appropriate orders placed.  Steven Lowe was informed that the remainder of the evaluation will be completed by another provider, this initial triage assessment does not replace that evaluation, and the importance of remaining in the ED until their evaluation is complete.  Patient reporting with left pinky pain denies any injury trauma or falls.  No rash.  Denies any wounds or animal bites.  Patient reports this is been ongoing for 1 day.     Steven Warren SAILOR, PA-C 10/21/23 1121

## 2023-10-21 NOTE — ED Notes (Signed)
 Tech attempted to collect repeat vitals and pt. Stated, "I dont need my vitals taken again, I need a room."

## 2023-10-21 NOTE — ED Triage Notes (Signed)
 Patient complains of left hand pain for awhile.  Reports last knuckle is swollen and difficulty to move his left ring and pinky finger.
